# Patient Record
Sex: Male | Born: 1995
Health system: Southern US, Community
[De-identification: ages and names within clinical notes are randomized; demographics above are authoritative.]

## PROBLEM LIST (undated history)

## (undated) DIAGNOSIS — F418 Other specified anxiety disorders: Secondary | ICD-10-CM

## (undated) DIAGNOSIS — M303 Mucocutaneous lymph node syndrome [Kawasaki]: Secondary | ICD-10-CM

## (undated) DIAGNOSIS — E559 Vitamin D deficiency, unspecified: Secondary | ICD-10-CM

## (undated) DIAGNOSIS — R251 Tremor, unspecified: Secondary | ICD-10-CM

## (undated) DIAGNOSIS — T7840XA Allergy, unspecified, initial encounter: Secondary | ICD-10-CM

## (undated) HISTORY — DX: Vitamin D deficiency, unspecified: E55.9

## (undated) HISTORY — DX: Tremor, unspecified: R25.1

## (undated) HISTORY — DX: Other specified anxiety disorders: F41.8

## (undated) HISTORY — DX: Allergy, unspecified, initial encounter: T78.40XA

## (undated) HISTORY — DX: Mucocutaneous lymph node syndrome (kawasaki): M30.3

---
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
## 2015-01-04 ENCOUNTER — Emergency Department (HOSPITAL_COMMUNITY)
Admission: EM | Admit: 2015-01-04 | Discharge: 2015-01-04 | Disposition: A | Discharge status: Home / Self Care | Payer: 59 | Attending: Emergency Medicine | Admitting: Emergency Medicine

## 2015-01-04 ENCOUNTER — Encounter (HOSPITAL_COMMUNITY): Payer: Self-pay

## 2015-01-04 DIAGNOSIS — S0502XA Injury of conjunctiva and corneal abrasion without foreign body, left eye, initial encounter: Secondary | ICD-10-CM | POA: Insufficient documentation

## 2015-01-04 DIAGNOSIS — X58XXXA Exposure to other specified factors, initial encounter: Secondary | ICD-10-CM | POA: Insufficient documentation

## 2015-01-04 DIAGNOSIS — Y9389 Activity, other specified: Secondary | ICD-10-CM | POA: Insufficient documentation

## 2015-01-04 DIAGNOSIS — Y9289 Other specified places as the place of occurrence of the external cause: Secondary | ICD-10-CM | POA: Insufficient documentation

## 2015-01-04 DIAGNOSIS — Y998 Other external cause status: Secondary | ICD-10-CM | POA: Diagnosis not present

## 2015-01-04 DIAGNOSIS — S0592XA Unspecified injury of left eye and orbit, initial encounter: Secondary | ICD-10-CM | POA: Diagnosis present

## 2015-01-04 DIAGNOSIS — Z8739 Personal history of other diseases of the musculoskeletal system and connective tissue: Secondary | ICD-10-CM | POA: Diagnosis not present

## 2015-01-04 MED ORDER — PROPARACAINE HCL 0.5 % OP SOLN
1.0000 [drp] | Freq: Once | OPHTHALMIC | Status: AC
  Administered 2015-01-04: 1 [drp] via OPHTHALMIC
  Filled 2015-01-04: qty 15

## 2015-01-04 MED ORDER — TETRACAINE HCL 0.5 % OP SOLN
2.0000 [drp] | Freq: Once | OPHTHALMIC | Status: AC
Start: 2015-01-04 — End: 2015-01-04
  Filled 2015-01-04: qty 2

## 2015-01-04 MED ORDER — ERYTHROMYCIN 5 MG/GM OP OINT: TOPICAL_OINTMENT | OPHTHALMIC | Status: DC

## 2015-01-04 MED ORDER — FLUORESCEIN SODIUM 1 MG OP STRP
1.0000 | ORAL_STRIP | Freq: Once | OPHTHALMIC | Status: AC
  Administered 2015-01-04: 1 via OPHTHALMIC
  Filled 2015-01-04: qty 1

## 2015-01-04 MED ORDER — FLUORESCEIN SODIUM 1 MG OP STRP: 1.0000 | ORAL_STRIP | Freq: Once | OPHTHALMIC | Status: DC

## 2015-01-04 NOTE — Discharge Instructions (Signed)
PLEASE FOLLOW-UP WITH AN OPHTHALMOLOGIST TOMORROW. Please return to the ER if you develop worsening eye pain, discharge, fever, chills, or other worsening symptoms.  Please obtain all of your results from medical records or have your doctors office obtain the results - share them with your doctor - you should be seen at your doctors office in the next 2 days. Call today to arrange your follow up. Take the medications as prescribed. Please review all of the medicines and only take them if you do not have an allergy to them. Please be aware that if you are taking birth control pills, taking other prescriptions, ESPECIALLY ANTIBIOTICS may make the birth control ineffective - if this is the case, either do not engage in sexual activity or use alternative methods of birth control such as condoms until you have finished the medicine and your family doctor says it is OK to restart them. If you are on a blood thinner such as COUMADIN, be aware that any other medicine that you take may cause the coumadin to either work too much, or not enough - you should have your coumadin level rechecked in next 7 days if this is the case.  ?  It is also a possibility that you have an allergic reaction to any of the medicines that you have been prescribed - Everybody reacts differently to medications and while MOST people have no trouble with most medicines, you may have a reaction such as nausea, vomiting, rash, swelling, shortness of breath. If this is the case, please stop taking the medicine immediately and contact your physician.  ?  You should return to the ER if you develop severe or worsening symptoms.    Corneal Abrasion The cornea is the clear covering at the front and center of the eye. When looking at the colored portion of the eye (iris), you are looking through the cornea. This very thin tissue is made up of many layers. The surface layer is a single layer of cells (corneal epithelium) and is one of the most  sensitive tissues in the body. If a scratch or injury causes the corneal epithelium to come off, it is called a corneal abrasion. If the injury extends to the tissues below the epithelium, the condition is called a corneal ulcer. CAUSES   Scratches.  Trauma.  Foreign body in the eye. Some people have recurrences of abrasions in the area of the original injury even after it has healed (recurrent erosion syndrome). Recurrent erosion syndrome generally improves and goes away with time. SYMPTOMS   Eye pain.  Difficulty or inability to keep the injured eye open.  The eye becomes very sensitive to light.  Recurrent erosions tend to happen suddenly, first thing in the morning, usually after waking up and opening the eye. DIAGNOSIS  Your health care provider can diagnose a corneal abrasion during an eye exam. Dye is usually placed in the eye using a drop or a small paper strip moistened by your tears. When the eye is examined with a special light, the abrasion shows up clearly because of the dye. TREATMENT   Small abrasions may be treated with antibiotic drops or ointment alone.  A pressure patch may be put over the eye. If this is done, follow your doctor's instructions for when to remove the patch. Do not drive or use machines while the eye patch is on. Judging distances is hard to do with a patch on. If the abrasion becomes infected and spreads to the deeper tissues  of the cornea, a corneal ulcer can result. This is serious because it can cause corneal scarring. Corneal scars interfere with light passing through the cornea and cause a loss of vision in the involved eye. HOME CARE INSTRUCTIONS  Use medicine or ointment as directed. Only take over-the-counter or prescription medicines for pain, discomfort, or fever as directed by your health care provider.  Do not drive or operate machinery if your eye is patched. Your ability to judge distances is impaired.  If your health care provider has  given you a follow-up appointment, it is very important to keep that appointment. Not keeping the appointment could result in a severe eye infection or permanent loss of vision. If there is any problem keeping the appointment, let your health care provider know. SEEK MEDICAL CARE IF:   You have pain, light sensitivity, and a scratchy feeling in one eye or both eyes.  Your pressure patch keeps loosening up, and you can blink your eye under the patch after treatment.  Any kind of discharge develops from the eye after treatment or if the lids stick together in the morning.  You have the same symptoms in the morning as you did with the original abrasion days, weeks, or months after the abrasion healed.   This information is not intended to replace advice given to you by your health care provider. Make sure you discuss any questions you have with your health care provider.   Document Released: 03/02/2000 Document Revised: 11/24/2014 Document Reviewed: 11/10/2012 Elsevier Interactive Patient Education [employer]

## 2015-01-04 NOTE — ED Notes (Signed)
Pt states he felt like he had something in his left eye 3 days ago and tried rinsing it out with water, clear eyes and saline. Now still feels like there is something sticking him.

## 2015-01-04 NOTE — ED Provider Notes (Signed)
CSN: [id number]     Arrival date & time 01/04/15  0550 History   First MD Initiated Contact with Patient 01/04/15 [phone]     Chief Complaint  Patient presents with  . Eye Problem    Patient is a 19 y.o. male presenting with eye problem.  Eye Problem  Mr. Speyer is a 19 y.o. male with PMH of Kawasaki disease who presents to the ED for evaluation of left eye pain and sensation of foreign body. He states that his symptoms began three days ago. Pt reports he feels like there is something in his eye and it is constant. He reports some pain on the lower lateral side of his left eye with movement. States his left eye has been watering more than usual but denies changes in vision. Does not remember any injury, trauma, or foreign body. He states he has tried rinsing his eye with water and saline with no relief. Denies periorbital swelling or erythema. Denies purulent discharge. Denies fever, chills, headache, N/V. He does endorse wearing contact lenses at baseline but has not been wearing them the past 3 days. Reports he changes his contacts nightly as instructed. States right eye feels normal.   Past Medical History  Diagnosis Date  . Kawasaki disease (HCC)   . Allergy    History reviewed. No pertinent past surgical history. Family History  Problem Relation Age of Onset  . Hypertension Paternal Grandmother   . Arthritis Paternal Grandfather   . Seizures Maternal Grandfather    Social History  Substance Use Topics  . Smoking status: Never Smoker   . Smokeless tobacco: Never Used  . Alcohol Use: No    Review of Systems  All other systems reviewed and are negative.     Allergies  Review of patient's allergies indicates no known allergies.  Home Medications   Prior to Admission medications   Medication Sig Start Date End Date Taking? Authorizing Provider  acetaminophen (TYLENOL) 325 MG tablet Take 325-650 mg by mouth every 6 (six) hours as needed for moderate pain or fever (fever).    Yes Historical Provider, MD  guaiFENesin (ROBITUSSIN) 100 MG/5ML liquid Take 200 mg by mouth 3 (three) times daily as needed for cough (cough).   Yes Historical Provider, MD  Hyprom-Naphaz-Polysorb-Zn Sulf (CLEAR EYES COMPLETE OP) Place 2 drops into the left eye 2 (two) times daily as needed (dryness/redness).   Yes Historical Provider, MD  ibuprofen (ADVIL,MOTRIN) 200 MG tablet Take 400-600 mg by mouth every 6 (six) hours as needed for fever (fever).   Yes Historical Provider, MD  Pseudoeph-Doxylamine-DM-APAP (NYQUIL PO) Take 30 mLs by mouth 2 (two) times daily as needed (cold symptoms).   Yes Historical Provider, MD  erythromycin ophthalmic ointment Place a 1/2 inch ribbon of ointment into the lower eyelid four times daily for three days. 01/04/15   Ace Gins Joyia Riehle, PA-C   BP 134/72 mmHg  Pulse 73  Temp(Src) 98.3 F (36.8 C) (Oral)  Resp 16  Ht  (1.854 m)  Wt 135 lb (61.236 kg)  BMI 17.82 kg/m2  SpO2 100% Physical Exam  Constitutional: He is oriented to person, place, and time. He appears well-developed and well-nourished.  HENT:  Right Ear: External ear normal.  Left Ear: External ear normal.  Mouth/Throat: Oropharynx is clear and moist.  Eyes: Conjunctivae, EOM and lids are normal. Pupils are equal, round, and reactive to light. Lids are everted and swept, no foreign bodies found.    PERRL. No conjunctival injection or  discharge bilaterally. No erythema or edema bilaterally. Denies tenderness to palpation bilaterally. EOM intact. Fluorescein and wood's lamp exam reveals small 1-352mm round corneal abrasion as diagramed.   Neck: Neck supple.  Cardiovascular: Normal rate, regular rhythm and normal heart sounds.   Pulmonary/Chest: Effort normal and breath sounds normal. No respiratory distress.  Abdominal: Soft. Bowel sounds are normal. There is no tenderness.  Lymphadenopathy:    He has no cervical adenopathy.  Neurological: He is alert and oriented to person, place, and time.    Skin: Skin is warm and dry. No erythema.  Nursing note and vitals reviewed.   ED Course  Procedures (including critical care time) Labs Review Labs Reviewed - No data to display  Imaging Review No results found. I have personally reviewed and evaluated these images and lab results as part of my medical decision-making.   EKG Interpretation None        Visual Acuity  Right Eye Distance:   Left Eye Distance:   Bilateral Distance:    Right Eye Near: R Near: 20/70 Left Eye Near:  L Near: 20/70 Bilateral Near:  20/50   MDM   Final diagnoses:  Corneal abrasion, left, initial encounter    Presentation and description of symptoms consistent with small corneal abrasion. No fever, erythema, or edema to suggest periorbital or postseptal cellulitis. Will give rx for erythromycin ointment with strict instructions to follow-up with ophthamologist tomorrow. Patient to refrain from contact lens wear until resolution of symptoms. He states his understanding. Return precautions given.    Carlene CoriaSerena Y Jayvian Escoe, PA-C 01/04/15 [id number]  Eber HongBrian Miller, MD 01/08/15 [phone]

## 2016-02-06 IMAGING — DX DG CHEST 2V
2 series · 2 of 2 positions shown · non-contrast
Comparison: None.

CLINICAL DATA: Acute onset of cough and fever for 3 days. Initial
encounter.

EXAM:
CHEST  2 VIEW

[chest pa]
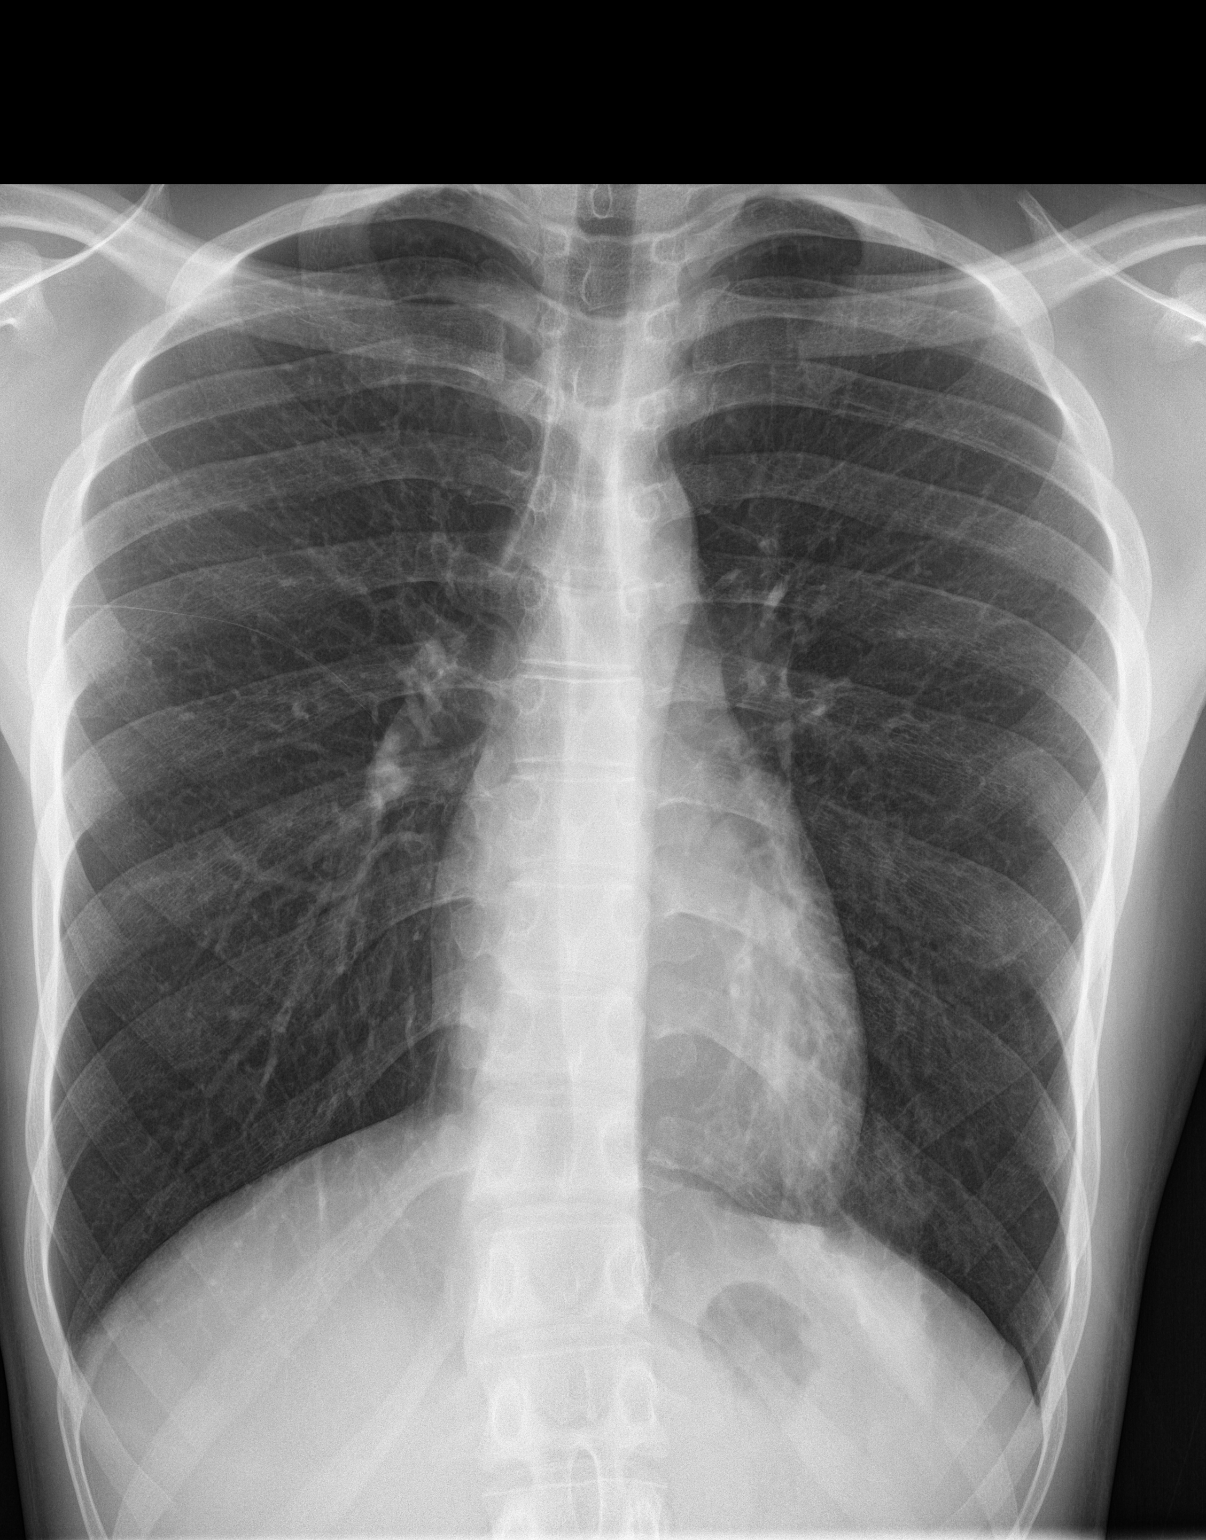

[chest lat]
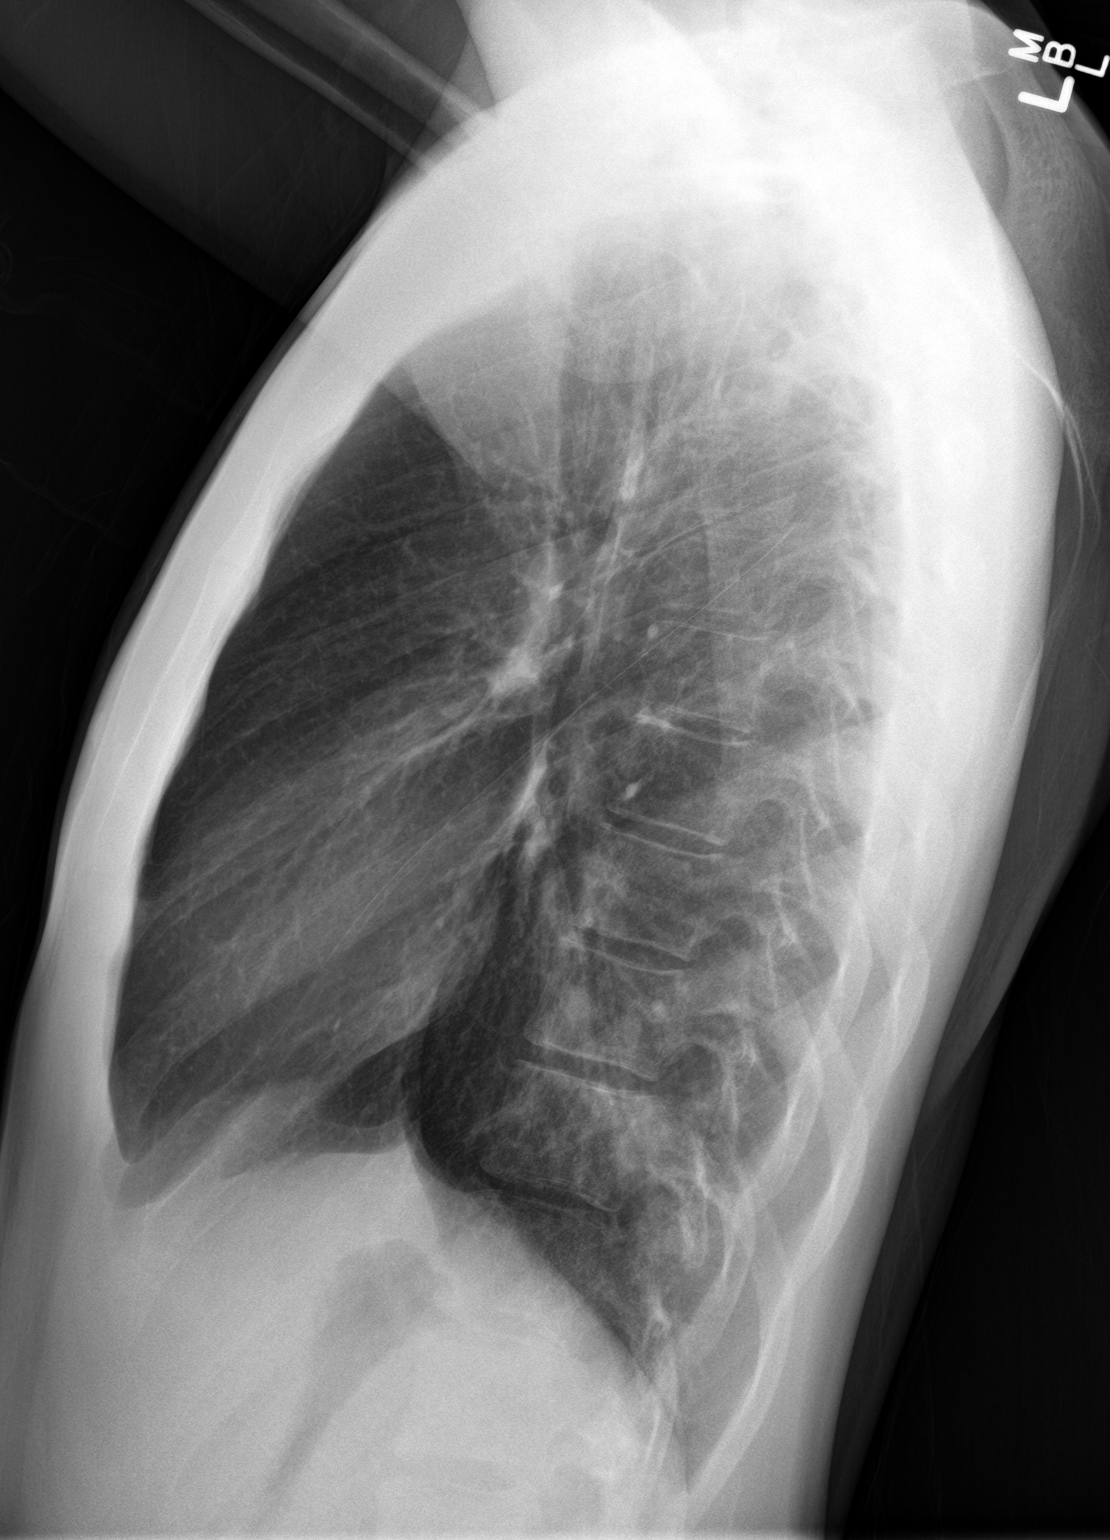

[2 of 2 positions shown; findings below may reference images not displayed]

FINDINGS: The lungs are well-aerated. Mild bibasilar airspace opacity is
concerning for pneumonia. There is no evidence of pleural effusion
or pneumothorax.

The heart is normal in size; the mediastinal contour is within
normal limits. No acute osseous abnormalities are seen.
IMPRESSION: Mild left basilar pneumonia noted.

## 2016-02-07 IMAGING — CR DG CHEST 2V
2 series · 2 of 2 positions shown · non-contrast
Comparison: PA and lateral chest 04/17/2014.

CLINICAL DATA: Cough and fever for 3-4 days.

EXAM:
CHEST  2 VIEW

[w chest pa]
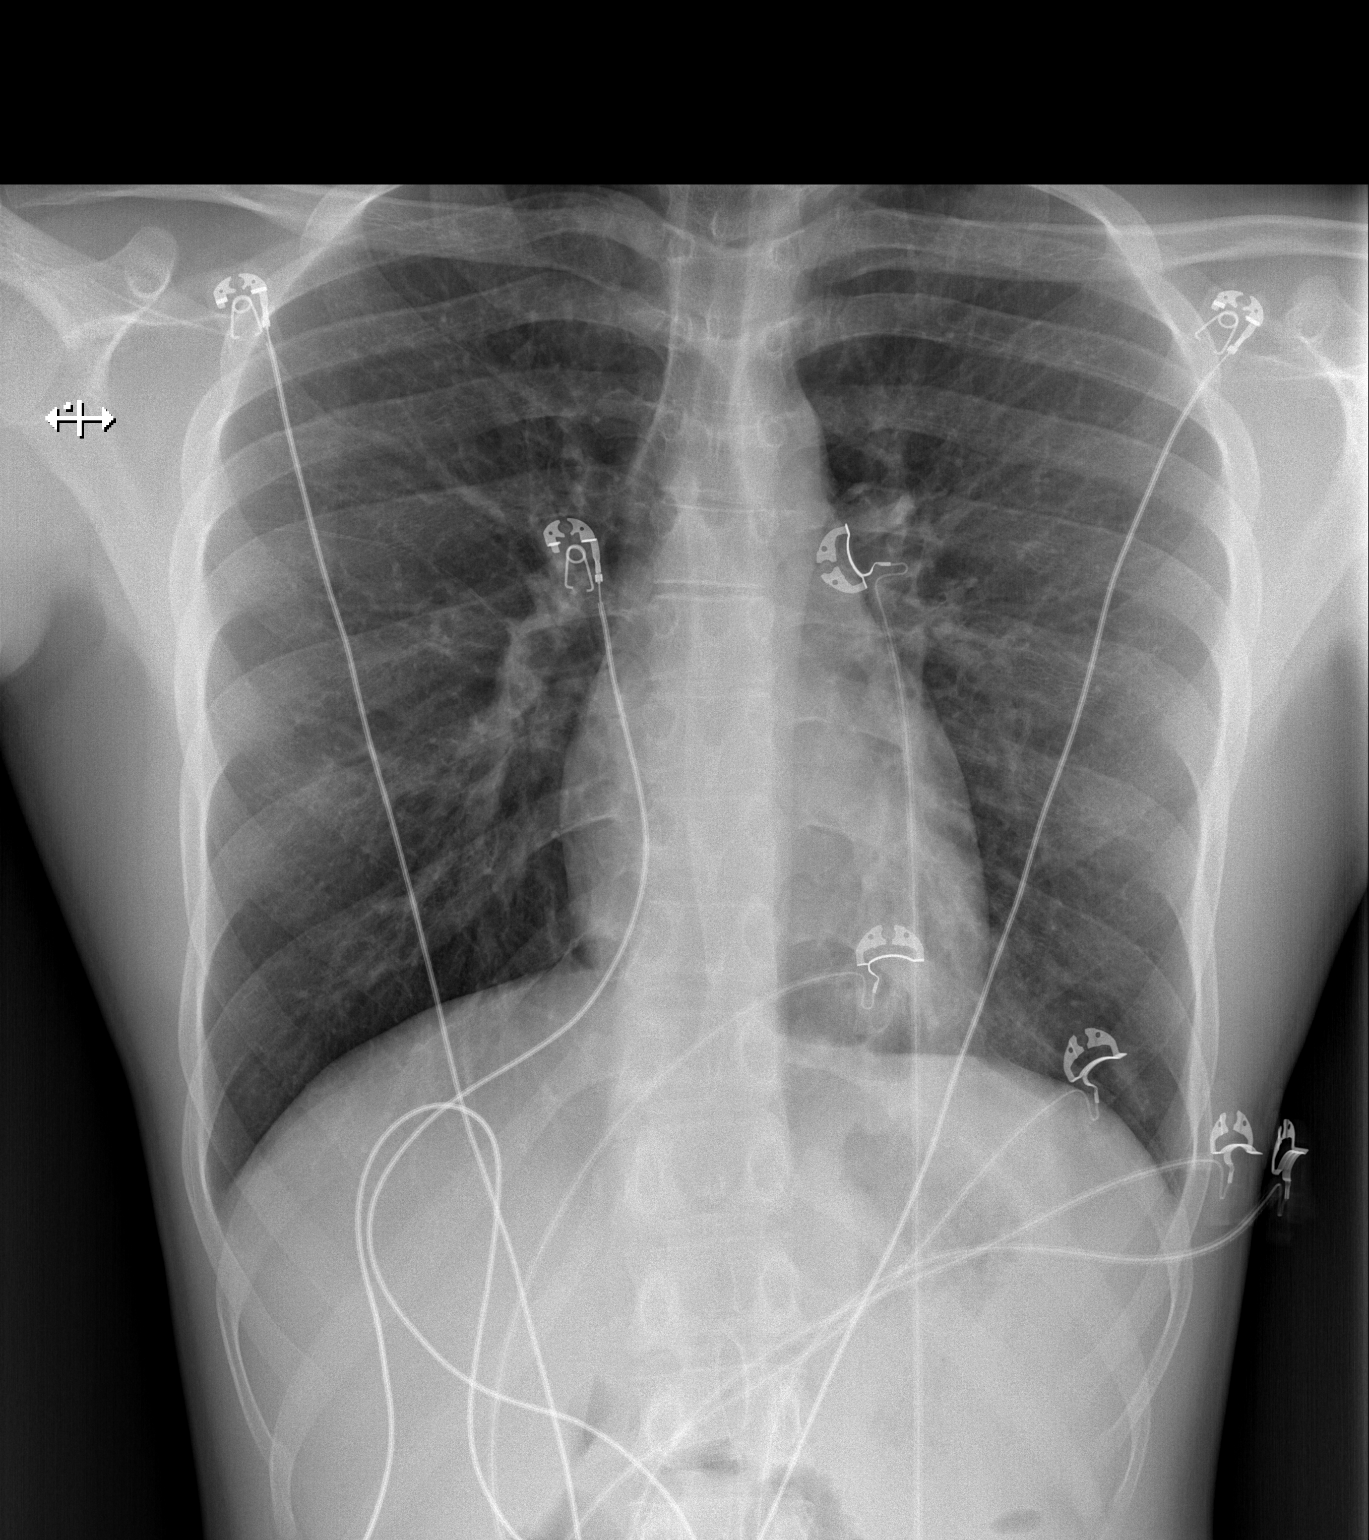

[w chest lat]
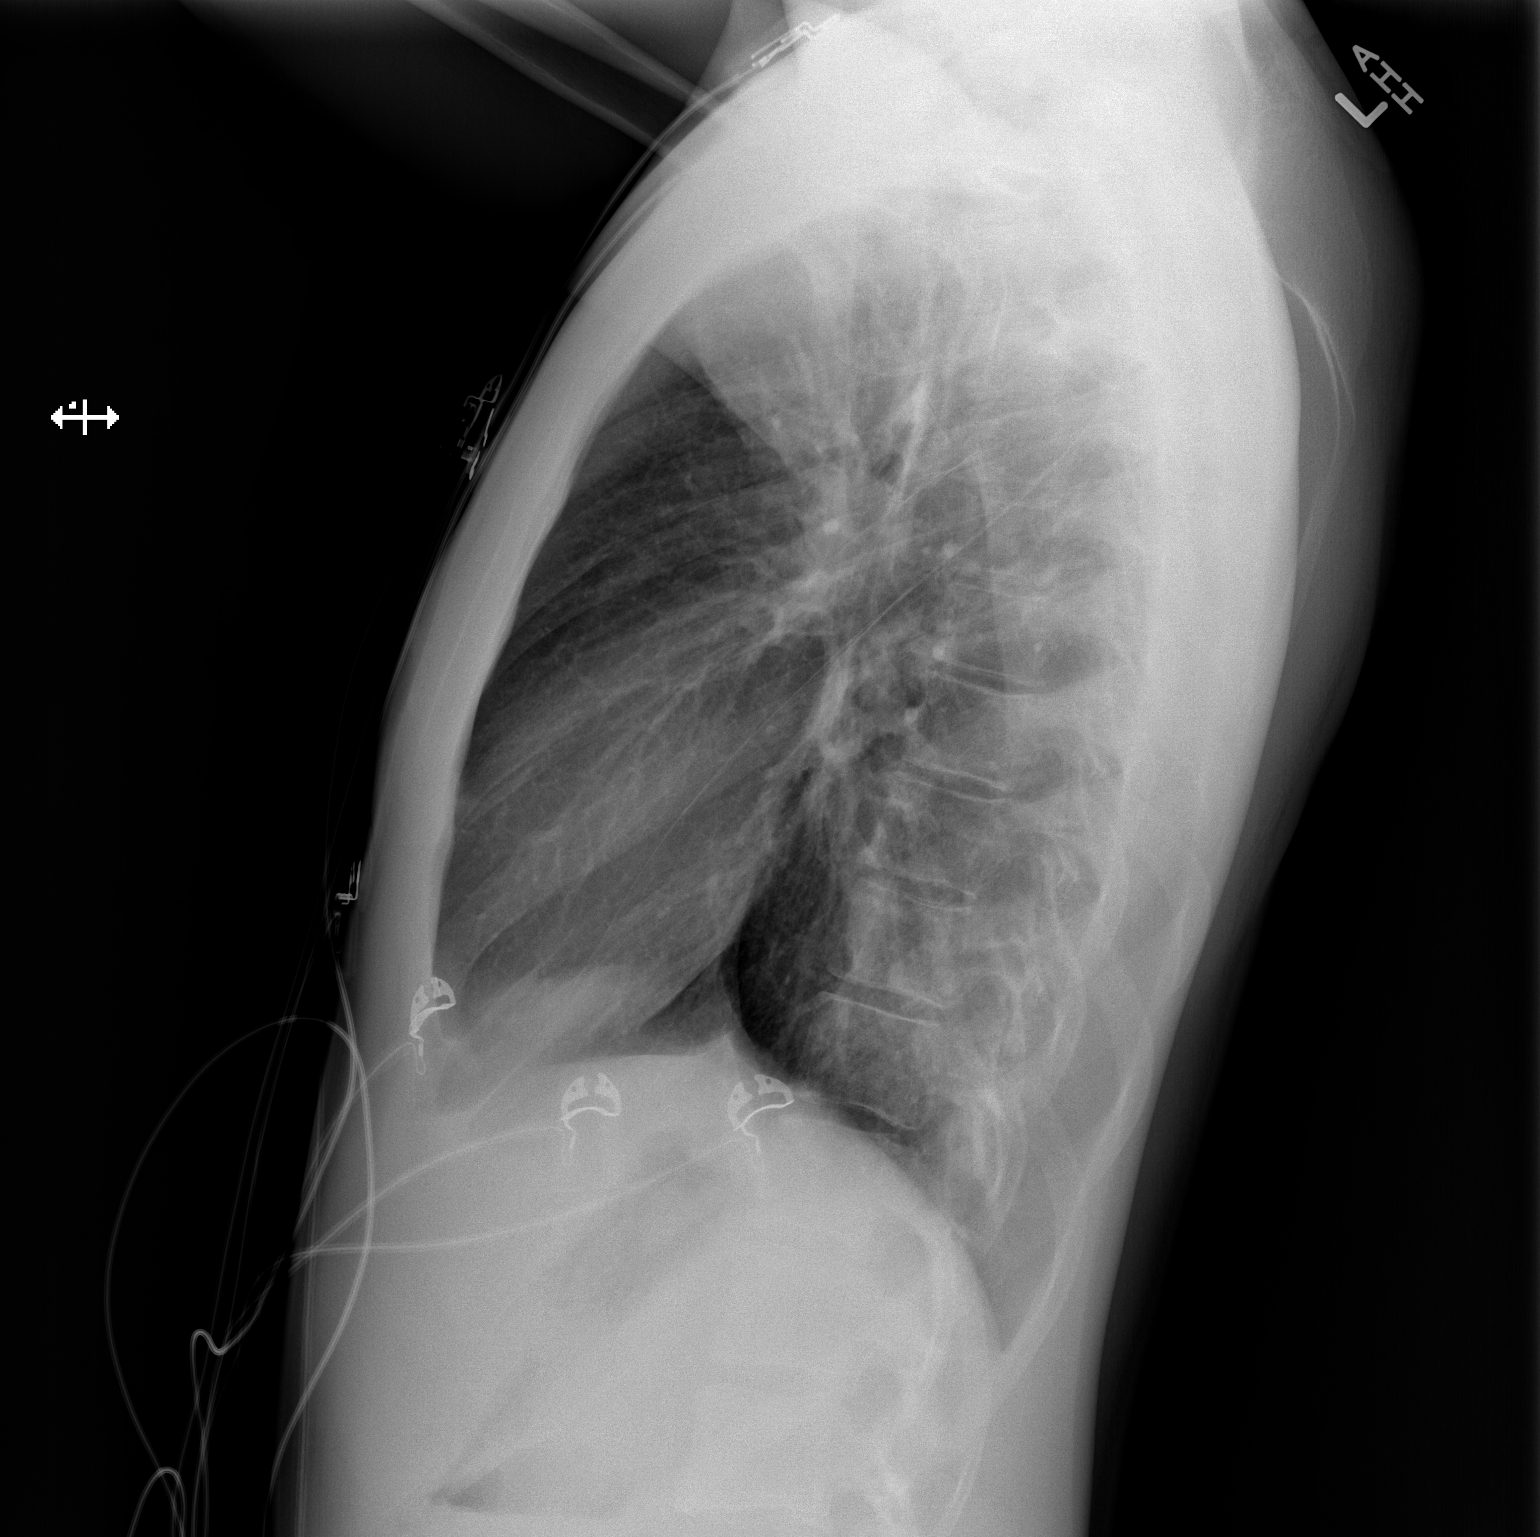

[2 of 2 positions shown; findings below may reference images not displayed]

FINDINGS: Heart size and mediastinal contours are within normal limits. Both
lungs are clear. Visualized skeletal structures are unremarkable.
IMPRESSION: No acute disease.

## 2016-04-13 DIAGNOSIS — R7303 Prediabetes: Secondary | ICD-10-CM | POA: Diagnosis not present

## 2016-04-13 DIAGNOSIS — R05 Cough: Secondary | ICD-10-CM | POA: Diagnosis not present

## 2016-12-05 DIAGNOSIS — N644 Mastodynia: Secondary | ICD-10-CM | POA: Diagnosis not present

## 2016-12-05 DIAGNOSIS — E559 Vitamin D deficiency, unspecified: Secondary | ICD-10-CM | POA: Diagnosis not present

## 2016-12-05 DIAGNOSIS — R7303 Prediabetes: Secondary | ICD-10-CM | POA: Diagnosis not present

## 2016-12-11 ENCOUNTER — Other Ambulatory Visit: Payer: Self-pay | Admitting: Internal Medicine

## 2016-12-11 DIAGNOSIS — N644 Mastodynia: Secondary | ICD-10-CM

## 2016-12-19 ENCOUNTER — Ambulatory Visit
Admission: RE | Admit: 2016-12-19 | Discharge: 2016-12-19 | Disposition: A | Discharge status: Home / Self Care | Payer: 59 | Source: Ambulatory Visit | Attending: Internal Medicine | Admitting: Internal Medicine

## 2016-12-19 DIAGNOSIS — N644 Mastodynia: Secondary | ICD-10-CM | POA: Diagnosis not present

## 2017-01-30 DIAGNOSIS — R7303 Prediabetes: Secondary | ICD-10-CM | POA: Diagnosis not present

## 2017-05-14 DIAGNOSIS — J069 Acute upper respiratory infection, unspecified: Secondary | ICD-10-CM | POA: Diagnosis not present

## 2017-05-14 DIAGNOSIS — E559 Vitamin D deficiency, unspecified: Secondary | ICD-10-CM | POA: Diagnosis not present

## 2017-05-14 DIAGNOSIS — R040 Epistaxis: Secondary | ICD-10-CM | POA: Diagnosis not present

## 2017-05-14 DIAGNOSIS — R400 Somnolence: Secondary | ICD-10-CM | POA: Diagnosis not present

## 2017-06-25 DIAGNOSIS — R7303 Prediabetes: Secondary | ICD-10-CM | POA: Diagnosis not present

## 2017-07-23 DIAGNOSIS — R7303 Prediabetes: Secondary | ICD-10-CM | POA: Diagnosis not present

## 2017-10-29 DIAGNOSIS — Z0000 Encounter for general adult medical examination without abnormal findings: Secondary | ICD-10-CM | POA: Diagnosis not present

## 2017-10-29 DIAGNOSIS — Z136 Encounter for screening for cardiovascular disorders: Secondary | ICD-10-CM | POA: Diagnosis not present

## 2017-11-28 DIAGNOSIS — Z0100 Encounter for examination of eyes and vision without abnormal findings: Secondary | ICD-10-CM | POA: Diagnosis not present

## 2017-12-10 DIAGNOSIS — J028 Acute pharyngitis due to other specified organisms: Secondary | ICD-10-CM | POA: Diagnosis not present

## 2017-12-30 DIAGNOSIS — E559 Vitamin D deficiency, unspecified: Secondary | ICD-10-CM | POA: Diagnosis not present

## 2017-12-30 DIAGNOSIS — R7303 Prediabetes: Secondary | ICD-10-CM | POA: Diagnosis not present

## 2018-01-02 ENCOUNTER — Ambulatory Visit: Payer: 59 | Admitting: Registered"

## 2018-01-15 DIAGNOSIS — E559 Vitamin D deficiency, unspecified: Secondary | ICD-10-CM | POA: Diagnosis not present

## 2018-01-15 DIAGNOSIS — R7303 Prediabetes: Secondary | ICD-10-CM | POA: Diagnosis not present

## 2018-01-15 DIAGNOSIS — J302 Other seasonal allergic rhinitis: Secondary | ICD-10-CM | POA: Diagnosis not present

## 2018-12-25 ENCOUNTER — Other Ambulatory Visit: Payer: Self-pay

## 2018-12-25 DIAGNOSIS — Z20822 Contact with and (suspected) exposure to covid-19: Secondary | ICD-10-CM

## 2018-12-27 ENCOUNTER — Ambulatory Visit (HOSPITAL_COMMUNITY)
Admission: EM | Admit: 2018-12-27 | Discharge: 2018-12-27 | Disposition: A | Discharge status: Home / Self Care | Payer: BC Managed Care – PPO | Attending: Family Medicine | Admitting: Family Medicine

## 2018-12-27 ENCOUNTER — Encounter (HOSPITAL_COMMUNITY): Payer: Self-pay | Admitting: Emergency Medicine

## 2018-12-27 ENCOUNTER — Other Ambulatory Visit: Payer: Self-pay

## 2018-12-27 DIAGNOSIS — J029 Acute pharyngitis, unspecified: Secondary | ICD-10-CM | POA: Diagnosis not present

## 2018-12-27 LAB — NOVEL CORONAVIRUS, NAA: SARS-CoV-2, NAA: NOT DETECTED

## 2018-12-27 LAB — POCT RAPID STREP A: Streptococcus, Group A Screen (Direct): NEGATIVE

## 2018-12-27 MED ORDER — PREDNISONE 10 MG (21) PO TBPK: ORAL_TABLET | Freq: Every day | ORAL | 0 refills | Status: DC

## 2018-12-27 NOTE — Discharge Instructions (Signed)
You may use over the counter ibuprofen or acetaminophen as needed.  For a sore throat, over the counter products such as [employer] Peroxyl Mouth Sore Rinse or Chloraseptic Sore Throat Spray may provide some temporary relief. Your rapid strep test was negative today. We have sent your throat swab for culture and will let you know of any positive results. 

## 2018-12-27 NOTE — ED Triage Notes (Signed)
Pt states last year he was given an antibiotic for his throat, unsure what dx. States he has allergies and is trying to have his tonsils removed. C/o cough and sore throat. Tested negative for Covid two days ago.

## 2018-12-29 NOTE — ED Provider Notes (Addendum)
[facility]   [id number] 12/27/18 Arrival Time: 1601  ASSESSMENT & PLAN:  1. Sore throat     No signs of peritonsillar abscess. Discussed. Rapid strep negative. Throat culture sent. Declines monospot.  Meds ordered this encounter  Medications  . predniSONE (STERAPRED UNI-PAK 21 TAB) 10 MG (21) TBPK tablet    Sig: Take by mouth daily. Take as directed.    Dispense:  21 tablet    Refill:  0    Labs Reviewed  CULTURE, GROUP A STREP [facility])  POCT RAPID STREP A    OTC analgesics and throat care as needed    Discharge Instructions      You may use over the counter ibuprofen or acetaminophen as needed.   For a sore throat, over the counter products such as [employer] Peroxyl Mouth Sore Rinse or Chloraseptic Sore Throat Spray may provide some temporary relief.  Your rapid strep test was negative today. We have sent your throat swab for culture and will let you know of any positive results.   Reviewed expectations re: course of current medical issues. Questions answered. Outlined signs and symptoms indicating need for more acute intervention. Patient verbalized understanding. After Visit Summary given.   SUBJECTIVE:  Marco Benjamin is a 23 y.o. male who reports a sore throat. Describes as "feeling swollen and a little painful". Onset gradual beginning a few days ago. Symptoms have progressed to a point and plateaued since beginning; without voice changes. No respiratory symptoms. Normal PO intake but reports discomfort with swallowing. No specific alleviating factors. Fever reported: Fever: absent. No neck pain or swelling. No associated nausea, vomiting, or abdominal pain. Known sick contacts: none. Recent travel: none. History of similar sore throats once a year. Antibiotics have not helped in the past. "Usually goes away in several days" but does report feeling of throat swelling is more than usual. OTC treatment: none reported.  ROS: As per HPI. All other  systems negative.    OBJECTIVE:  Vitals:   12/27/18 1613  BP: 130/73  Pulse: 92  Resp: 18  Temp: 98.1 F (36.7 C)  SpO2: 100%     General appearance: alert; no distress HEENT: throat with bilateral tonsillar hypertrophy and moderate erythema; uvula is midline Neck: supple with FROM; no lymphadenopathy CV: RRR Lungs: clear to auscultation bilaterally Abd: soft; non-tender Skin: reveals no rash; warm and dry Neuro: normal gait Psychological: alert and cooperative; normal mood and affect  No Known Allergies  Past Medical History:  Diagnosis Date  . Allergy   . Kawasaki disease [facility])    Social History   Socioeconomic History  . Marital status: Single    Spouse name: Not on file  . Number of children: Not on file  . Years of education: Not on file  . Highest education level: Not on file  Occupational History  . Not on file  Social Needs  . Financial resource strain: Not on file  . Food insecurity    Worry: Not on file    Inability: Not on file  . Transportation needs    Medical: Not on file    Non-medical: Not on file  Tobacco Use  . Smoking status: Never Smoker  . Smokeless tobacco: Never Used  Substance and Sexual Activity  . Alcohol use: No  . Drug use: No  . Sexual activity: Not Currently  Lifestyle  . Physical activity    Days per week: Not on file    Minutes per session: Not on  file  . Stress: Not on file  Relationships  . Social Herbalist on phone: Not on file    Gets together: Not on file    Attends religious service: Not on file    Active member of club or organization: Not on file    Attends meetings of clubs or organizations: Not on file    Relationship status: Not on file  . Intimate partner violence    Fear of current or ex partner: Not on file    Emotionally abused: Not on file    Physically abused: Not on file    Forced sexual activity: Not on file  Other Topics Concern  . Not on file  Social History Narrative  . Not  on file   Family History  Problem Relation Age of Onset  . Hypertension Paternal Grandmother   . Breast cancer Paternal Grandmother   . Arthritis Paternal Grandfather   . Seizures Maternal Reymundo Poll, MD 12/29/18 [id number]    Vanessa Kick, MD 12/29/18 [phone]

## 2018-12-30 LAB — CULTURE, GROUP A STREP (THRC)

## 2020-11-07 ENCOUNTER — Encounter: Payer: Self-pay | Admitting: *Deleted

## 2020-11-08 ENCOUNTER — Other Ambulatory Visit: Payer: Self-pay

## 2020-11-08 ENCOUNTER — Encounter: Payer: Self-pay | Admitting: Diagnostic Neuroimaging

## 2020-11-08 ENCOUNTER — Ambulatory Visit: Payer: BC Managed Care – PPO | Admitting: Diagnostic Neuroimaging

## 2020-11-08 VITALS — BP 124/74 | HR 92 | Ht 74.0 in | Wt 160.0 lb

## 2020-11-08 DIAGNOSIS — G252 Other specified forms of tremor: Secondary | ICD-10-CM | POA: Diagnosis not present

## 2020-11-08 NOTE — Progress Notes (Signed)
[facility]  PATIENT: Marco Benjamin DOB: Mar 08, 1996  REFERRING CLINICIAN: Norm Salt, PA HISTORY FROM: patient  REASON FOR VISIT: new consult    HISTORICAL  CHIEF COMPLAINT:  Chief Complaint  Patient presents with   Tremors    RM 7 alone Pt is well, half of his life he has always had tremors, people has always asked him is he cold. Recently in the last 3 month has been more noticeable.     HISTORY OF PRESENT ILLNESS:   25 year old male here for evaluation of tremor.  Symptoms started many years ago in childhood or teenage years.  Patient would have slight "shivering" movements and sensations and people would always ask him if he felt cold or not.  Over the years symptoms and fairly stable.  He mainly notes postural and action tremor of his arms.  No tremor in head or face.  No significant resting tremor.  No problems with legs.  No vision speech or swallowing problems.  No triggering or aggravating factors.  No change with anxiety, caffeine sleep or stress.  Patient was started on lamotrigine and propranolol recently for mood stabilization and anxiety.  No significant change since that time.  REVIEW OF SYSTEMS: Full 14 system review of systems performed and negative with exception of: As per HPI.  ALLERGIES: No Known Allergies  HOME MEDICATIONS: Outpatient Medications Prior to Visit  Medication Sig Dispense Refill   lamoTRIgine (LAMICTAL) 100 MG tablet SMARTSIG:1 Tablet(s) By Mouth Every Evening     propranolol (INDERAL) 10 MG tablet Take 10 mg by mouth 2 (two) times daily.     acetaminophen (TYLENOL) 325 MG tablet Take 325-650 mg by mouth every 6 (six) hours as needed for moderate pain or fever (fever).     Hyprom-Naphaz-Polysorb-Zn Sulf (CLEAR EYES COMPLETE OP) Place 2 drops into the left eye 2 (two) times daily as needed (dryness/redness).     ibuprofen (ADVIL,MOTRIN) 200 MG tablet Take 400-600 mg by mouth every 6 (six) hours as needed for  fever (fever).     predniSONE (STERAPRED UNI-PAK 21 TAB) 10 MG (21) TBPK tablet Take by mouth daily. Take as directed. 21 tablet 0   Pseudoeph-Doxylamine-DM-APAP (NYQUIL PO) Take 30 mLs by mouth 2 (two) times daily as needed (cold symptoms).     No facility-administered medications prior to visit.    PAST MEDICAL HISTORY: Past Medical History:  Diagnosis Date   Allergy    seasonal   Depression with anxiety    Kawasaki disease (HCC)    Tremor    Vitamin D deficiency     PAST SURGICAL HISTORY: History reviewed. No pertinent surgical history.  FAMILY HISTORY: Family History  Problem Relation Age of Onset   Seizures Maternal Grandfather    Hypertension Paternal Grandmother    Breast cancer Paternal Grandmother    Arthritis Paternal Grandfather     SOCIAL HISTORY: Social History   Socioeconomic History   Marital status: Single    Spouse name: Not on file   Number of children: Not on file   Years of education: Not on file   Highest education level: Not on file  Occupational History   Not on file  Tobacco Use   Smoking status: Never   Smokeless tobacco: Never  Substance and Sexual Activity   Alcohol use: No   Drug use: No   Sexual activity: Not Currently  Other Topics Concern   Not on file  Social History Narrative   Not on file  Social Determinants of Health   Financial Resource Strain: Not on file  Food Insecurity: Not on file  Transportation Needs: Not on file  Physical Activity: Not on file  Stress: Not on file  Social Connections: Not on file  Intimate Partner Violence: Not on file     PHYSICAL EXAM  GENERAL EXAM/CONSTITUTIONAL: Vitals:  Vitals:   11/08/20 1457  BP: 124/74  Pulse: 92  Weight: 160 lb (72.6 kg)  Height: 6\' 2"  (1.88 m)   Body mass index is 20.54 kg/m. Wt Readings from Last 3 Encounters:  11/08/20 160 lb (72.6 kg)  01/04/15 135 lb (61.2 kg) (18 %, Z= -0.91)*  04/18/14 138 lb 14.4 oz (63 kg) (28 %, Z= -0.60)*   * Growth  percentiles are based on CDC (Boys, 2-20 Years) data.   Patient is in no distress; well developed, nourished and groomed; neck is supple  CARDIOVASCULAR: Examination of carotid arteries is normal; no carotid bruits Regular rate and rhythm, no murmurs Examination of peripheral vascular system by observation and palpation is normal  EYES: Ophthalmoscopic exam of optic discs and posterior segments is normal; no papilledema or hemorrhages No results found.  MUSCULOSKELETAL: Gait, strength, tone, movements noted in Neurologic exam below  NEUROLOGIC: MENTAL STATUS:  No flowsheet data found. awake, alert, oriented to person, place and time recent and remote memory intact normal attention and concentration language fluent, comprehension intact, naming intact fund of knowledge appropriate  CRANIAL NERVE:  2nd - no papilledema on fundoscopic exam 2nd, 3rd, 4th, 6th - pupils equal and reactive to light, visual fields full to confrontation, extraocular muscles intact, no nystagmus 5th - facial sensation symmetric 7th - facial strength symmetric 8th - hearing intact 9th - palate elevates symmetrically, uvula midline 11th - shoulder shrug symmetric 12th - tongue protrusion midline  MOTOR:  normal bulk and tone, full strength in the BUE, BLE MILD POSTURAL TREMOR IN LUE  SENSORY:  normal and symmetric to light touch, temperature, vibration  COORDINATION:  finger-nose-finger, fine finger movements normal  REFLEXES:  deep tendon reflexes present and symmetric  GAIT/STATION:  narrow based gait     DIAGNOSTIC DATA (LABS, IMAGING, TESTING) - I reviewed patient records, labs, notes, testing and imaging myself where available.  Lab Results  Component Value Date   WBC 7.1 04/20/2014   HGB 11.6 (L) 04/20/2014   HCT 34.6 (L) 04/20/2014   MCV 91.3 04/20/2014   PLT 167 04/20/2014      Component Value Date/Time   NA 138 04/20/2014 0448   K 3.2 (L) 04/20/2014 0448   CL 106  04/20/2014 0448   CO2 23 04/20/2014 0448   GLUCOSE 92 04/20/2014 0448   BUN 5 (L) 04/20/2014 0448   CREATININE 0.79 04/20/2014 0448   CALCIUM 8.1 (L) 04/20/2014 0448   PROT 5.6 (L) 04/19/2014 0450   ALBUMIN 3.0 (L) 04/19/2014 0450   AST 29 04/19/2014 0450   ALT 22 04/19/2014 0450   ALKPHOS 59 04/19/2014 0450   BILITOT 0.4 04/19/2014 0450   GFRNONAA >90 04/20/2014 0448   GFRAA >90 04/20/2014 0448   Lab Results  Component Value Date   CHOL 93 04/19/2014   HDL 29 (L) 04/19/2014   LDLCALC 50 04/19/2014   TRIG 72 04/19/2014   CHOLHDL 3.2 04/19/2014   Lab Results  Component Value Date   HGBA1C 5.4 04/19/2014   No results found for: 06/18/2014 Lab Results  Component Value Date   TSH 1.05 07/03/2010      ASSESSMENT AND  PLAN  25 y.o. year old male here with mild postural tremor in upper extremities since teenage years, could be essential tremor versus enhanced physiologic tremor.   Dx:  1. Postural tremor       PLAN:  -Neurologic examination is unremarkable.  Likely represents mild enhanced physiologic tremor versus essential tremor.  Discussed options for further testing and treatment.  At this point symptoms are very mild and patient would like to monitor.  Could consider MRI of the brain, increase propranolol or primidone in the future.  Return for return to PCP, pending if symptoms worsen or fail to improve.    Suanne Marker, MD 11/08/2020, 3:35 PM Certified in Neurology, Neurophysiology and Neuroimaging  [facility] [address] [place], [place] [address] [phone]

## 2020-11-08 NOTE — Patient Instructions (Signed)
-   enhanced physiologic tremor vs essential tremor
